# Patient Record
Sex: Male | Born: 1995 | Race: White | Hispanic: No | Marital: Single | Smoking: Never smoker
Health system: Southern US, Community
[De-identification: ages and names within clinical notes are randomized; demographics above are authoritative.]

## PROBLEM LIST (undated history)

## (undated) DIAGNOSIS — J342 Deviated nasal septum: Secondary | ICD-10-CM

## (undated) HISTORY — PX: MANDIBLE SURGERY: SHX707

---

## 2017-06-02 ENCOUNTER — Encounter: Payer: Self-pay | Admitting: Emergency Medicine

## 2017-06-02 ENCOUNTER — Emergency Department
Admission: EM | Admit: 2017-06-02 | Discharge: 2017-06-02 | Disposition: A | Discharge status: Home / Self Care | Payer: Managed Care, Other (non HMO) | Attending: Emergency Medicine | Admitting: Emergency Medicine

## 2017-06-02 ENCOUNTER — Emergency Department: Payer: Managed Care, Other (non HMO)

## 2017-06-02 ENCOUNTER — Other Ambulatory Visit: Payer: Self-pay

## 2017-06-02 DIAGNOSIS — R42 Dizziness and giddiness: Secondary | ICD-10-CM | POA: Diagnosis present

## 2017-06-02 DIAGNOSIS — J189 Pneumonia, unspecified organism: Secondary | ICD-10-CM | POA: Insufficient documentation

## 2017-06-02 HISTORY — DX: Deviated nasal septum: J34.2

## 2017-06-02 LAB — BASIC METABOLIC PANEL
Anion gap: 9 (ref 5–15)
BUN: 17 mg/dL (ref 6–20)
CHLORIDE: 104 mmol/L (ref 101–111)
CO2: 25 mmol/L (ref 22–32)
CREATININE: 1.01 mg/dL (ref 0.61–1.24)
Calcium: 9.3 mg/dL (ref 8.9–10.3)
GFR calc non Af Amer: >60 mL/min (ref >60)
GLUCOSE: 112 mg/dL — AB (ref 65–99)
Potassium: 3.5 mmol/L (ref 3.5–5.1)
Sodium: 138 mmol/L (ref 135–145)

## 2017-06-02 LAB — URINALYSIS, COMPLETE (UACMP) WITH MICROSCOPIC
Bacteria, UA: NONE SEEN
Bilirubin Urine: NEGATIVE
Glucose, UA: NEGATIVE mg/dL
Hgb urine dipstick: NEGATIVE
Ketones, ur: NEGATIVE mg/dL
Leukocytes, UA: NEGATIVE
Nitrite: NEGATIVE
PROTEIN: NEGATIVE mg/dL
SPECIFIC GRAVITY, URINE: 1.015 (ref 1.005–1.030)
SQUAMOUS EPITHELIAL / LPF: NONE SEEN
pH: 7 (ref 5.0–8.0)

## 2017-06-02 LAB — CBC
HCT: 44.5 % (ref 40.0–52.0)
Hemoglobin: 15.7 g/dL (ref 13.0–18.0)
MCH: 31 pg (ref 26.0–34.0)
MCHC: 35.2 g/dL (ref 32.0–36.0)
MCV: 88.1 fL (ref 80.0–100.0)
Platelets: 201 10*3/uL (ref 150–440)
RBC: 5.05 MIL/uL (ref 4.40–5.90)
RDW: 12.2 % (ref 11.5–14.5)
WBC: 8.7 10*3/uL (ref 3.8–10.6)

## 2017-06-02 MED ORDER — AZITHROMYCIN 250 MG PO TABS: ORAL_TABLET | ORAL | 0 refills | Status: AC

## 2017-06-02 MED ORDER — ALBUTEROL SULFATE HFA 108 (90 BASE) MCG/ACT IN AERS: 2.0000 | INHALATION_SPRAY | Freq: Four times a day (QID) | RESPIRATORY_TRACT | 2 refills | Status: AC | PRN

## 2017-06-02 MED ORDER — ALBUTEROL SULFATE (2.5 MG/3ML) 0.083% IN NEBU
5.0000 mg | INHALATION_SOLUTION | Freq: Once | RESPIRATORY_TRACT | Status: AC
  Administered 2017-06-02: 5 mg via RESPIRATORY_TRACT
  Filled 2017-06-02: qty 6

## 2017-06-02 MED ORDER — AZITHROMYCIN 500 MG PO TABS
500.0000 mg | ORAL_TABLET | Freq: Once | ORAL | Status: AC
  Administered 2017-06-02: 500 mg via ORAL
  Filled 2017-06-02: qty 1

## 2017-06-02 NOTE — ED Provider Notes (Addendum)
[facility] Emergency Department Provider Note  ____________________________________________  Time seen: Approximately 6:28 PM  I have reviewed the triage vital signs and the nursing notes.   HISTORY  Chief Complaint Dizziness   HPI Timothy Montoya is a 22 y.o. male but no significant past medical history who presents for evaluation of lightheadedness and shortness of breath. Patient reports that he has had congestion for a month. 2 days ago had a fever at home. Denies any coughing. Today he reports that he felt better in the morning and went to the gym. Since he came back from the gym he has been feeling very short of breath and been feeling lightheaded like he is going to pass out. His congestion has gotten worse. He denies body aches, sore throat, nausea, vomiting, or chest pain. Shortness of breath is only present with exertion and is mild in intensity. He denies any wheezing or prior history of asthma. Is not a smoker.  Past Medical History:  Diagnosis Date  . Deviated septum     There are no active problems to display for this patient.   Past Surgical History:  Procedure Laterality Date  . MANDIBLE SURGERY      Prior to Admission medications   Medication Sig Start Date End Date Taking? Authorizing Provider  albuterol (PROVENTIL HFA;VENTOLIN HFA) 108 (90 Base) MCG/ACT inhaler Inhale 2 puffs into the lungs every 6 (six) hours as needed for wheezing or shortness of breath. 06/02/17   Nita SickleVeronese, [place], MD  azithromycin [facility](ZITHROMAX) 250 MG tablet Take 1 a day for 4 days 06/02/17   Nita SickleVeronese, [place], MD    Allergies Patient has no known allergies.  History reviewed. No pertinent family history.  Social History Social History   Tobacco Use  . Smoking status: Never Smoker  . Smokeless tobacco: Never Used  Substance Use Topics  . Alcohol use: No    Frequency: Never  . Drug use: No    Review of Systems  Constitutional: + fever and  lightheadedness Eyes: Negative for visual changes. ENT: Negative for sore throat. + congestion Neck: No neck pain  Cardiovascular: Negative for chest pain. Respiratory: + shortness of breath. Gastrointestinal: Negative for abdominal pain, vomiting or diarrhea. Genitourinary: Negative for dysuria. Musculoskeletal: Negative for back pain. Skin: Negative for rash. Neurological: Negative for headaches, weakness or numbness. Psych: No SI or HI  ____________________________________________   PHYSICAL EXAM:  VITAL SIGNS: ED Triage Vitals [06/02/17 1724]  Enc Vitals Group     BP (!) 140/48     Pulse Rate 81     Resp 16     Temp 98.6 F (37 C)     Temp Source Oral     SpO2 96 %     Weight      Height      Head Circumference      Peak Flow      Pain Score      Pain Loc      Pain Edu?      Excl. in GC?     Constitutional: Alert and oriented. Well appearing and in no apparent distress. HEENT:      Head: Normocephalic and atraumatic.         Eyes: Conjunctivae are normal. Sclera is non-icteric.       Mouth/Throat: Mucous membranes are moist.       Neck: Supple with no signs of meningismus. Cardiovascular: Regular rate and rhythm. No murmurs, gallops, or rubs. 2+ symmetrical distal pulses are  present in all extremities. No JVD. Respiratory: Normal respiratory effort. Lungs are clear to auscultation bilaterally. No wheezes, crackles, or rhonchi.  Gastrointestinal: Soft, non tender, and non distended with positive bowel sounds. No rebound or guarding. Musculoskeletal: Nontender with normal range of motion in all extremities. No edema, cyanosis, or erythema of extremities. Neurologic: Normal speech and language. Face is symmetric. Moving all extremities. No gross focal neurologic deficits are appreciated. Skin: Skin is warm, dry and intact. No rash noted. Psychiatric: Mood and affect are normal. Speech and behavior are normal.  ____________________________________________    LABS (all labs ordered are listed, but only abnormal results are displayed)  Labs Reviewed  BASIC METABOLIC PANEL - Abnormal; Notable for the following components:      Result Value   Glucose, Bld 112 (*)    All other components within normal limits  URINALYSIS, COMPLETE (UACMP) WITH MICROSCOPIC - Abnormal; Notable for the following components:   Color, Urine YELLOW (*)    APPearance CLEAR (*)    All other components within normal limits  CBC  CBG MONITORING, ED   ____________________________________________  EKG  ED ECG REPORT I, Nita Sickle, the attending physician, personally viewed and interpreted this ECG.  Normal sinus rhythm, rate of 73, normal intervals, normal axis, benign early repolarization, no STE or depression. No prior for comparison.  ____________________________________________  RADIOLOGY  CXR: Mild patchy opacity of left lung base developing pneumonia is not excluded.  ____________________________________________   PROCEDURES  Procedure(s) performed: None Procedures Critical Care performed:  None ____________________________________________   INITIAL IMPRESSION / ASSESSMENT AND PLAN / ED COURSE  22 y.o. male but no significant past medical history who presents for evaluation of lightheadedness and shortness of breath in the setting for several weeks of congestion and fever 3 days ago. patient is extremely well appearing, no distress, normal vitals, normal work of breathing, normal sats, lungs are clear to auscultation, he is afebrile. Labs show a normal white count, normal BMP. Chest x-ray concerning for possible developing pneumonia. Will start patient on azithromycin. We'll give albuterol to see if patient has improvement of SOB.   _________________________ 7:16 PM on 06/02/2017 -----------------------------------------  patient reports improvement of his shortness of breath after albuterol. His can be discharged home on a Z-Pak and  albuterol inhaler. Discussed return precautions and close follow-up.  As part of my medical decision making, I reviewed the following data within the electronic MEDICAL RECORD NUMBER Nursing notes reviewed and incorporated, Labs reviewed , Radiograph reviewed , Notes from prior ED visits and [place] Controlled Substance Database. EKG reviewed.    Pertinent labs & imaging results that were available during my care of the patient were reviewed by me and considered in my medical decision making (see chart for details).    ____________________________________________   FINAL CLINICAL IMPRESSION(S) / ED DIAGNOSES  Final diagnoses:  Community acquired pneumonia of left lung, unspecified part of lung      NEW MEDICATIONS STARTED DURING THIS VISIT:  ED Discharge Orders        Ordered    albuterol (PROVENTIL HFA;VENTOLIN HFA) 108 (90 Base) MCG/ACT inhaler  Every 6 hours PRN     06/02/17 1916    azithromycin (ZITHROMAX) 250 MG tablet     06/02/17 1916       Note:  This document was prepared using Dragon voice recognition software and may include unintentional dictation errors.    Don Perking, [place], MD 06/02/17 6045    Nita Sickle, MD 06/02/17 [phone]

## 2017-06-02 NOTE — ED Triage Notes (Signed)
First Nurse Note:  Arrives with C/O congestion and general sickness.  No SOB/ DOE.  Skin warm and dry.  NAD

## 2017-06-02 NOTE — ED Triage Notes (Addendum)
Pt started with some congestion this morning.  [facility] and feels different than his sinus congestion and allergies.  Felt lightheaded today and like going to pass out.  Ambulatory with steady gait to triage. NAD. "Biggest thing is I feel like can't breathe and going to pass out"/.

## 2017-06-02 NOTE — Discharge Instructions (Signed)

## 2017-09-16 ENCOUNTER — Other Ambulatory Visit: Payer: Self-pay

## 2017-09-16 ENCOUNTER — Emergency Department
Admission: EM | Admit: 2017-09-16 | Discharge: 2017-09-16 | Disposition: A | Discharge status: Home / Self Care | Payer: Managed Care, Other (non HMO) | Attending: Emergency Medicine | Admitting: Emergency Medicine

## 2017-09-16 ENCOUNTER — Emergency Department: Payer: Managed Care, Other (non HMO)

## 2017-09-16 DIAGNOSIS — R0602 Shortness of breath: Secondary | ICD-10-CM | POA: Insufficient documentation

## 2017-09-16 DIAGNOSIS — Z711 Person with feared health complaint in whom no diagnosis is made: Secondary | ICD-10-CM

## 2017-09-16 MED ORDER — IPRATROPIUM-ALBUTEROL 0.5-2.5 (3) MG/3ML IN SOLN
3.0000 mL | Freq: Once | RESPIRATORY_TRACT | Status: AC
  Administered 2017-09-16: 3 mL via RESPIRATORY_TRACT
  Filled 2017-09-16: qty 3

## 2017-09-16 MED ORDER — PREDNISONE 10 MG PO TABS: ORAL_TABLET | ORAL | 0 refills | Status: AC

## 2017-09-16 NOTE — ED Notes (Signed)
See triage note  States he developed some SOB which started yesterday  No fever  Min relief with inhaler which was left over from Jan

## 2017-09-16 NOTE — ED Provider Notes (Addendum)
[facility] Emergency Department Provider Note  ____________________________________________  Time seen: Approximately 1:13 PM  I have reviewed the triage vital signs and the nursing notes.   HISTORY  Chief Complaint Shortness of Breath    HPI Timothy Montoya is a 22 y.o. male that presents to the  emergency department for evaluation of shortness of breath for 2 days.  Patient states that shortness of breath started while working out at the gym.  After he got home from the gym, he felt a slightly dizzy. Dizziness improved after rest and eating.  He had another episode of shortness of breath later in the evening.  He felt well this morning but then while driving in his car began to feel short of breath again.  He states that shortness of breath is also elicited by eating.  He has been using the albuterol inhaler he got in January, which helps.  When he was here in January, he received an nebulizer treatment, which helped the most.  He has allergies.  He states that he also has a deviated septum and makes it difficult to breathe out of his nose. No recent illness. No palpitations, tachycardia, CP, nausea, vomiting.   Past Medical History:  Diagnosis Date  . Deviated septum     There are no active problems to display for this patient.   Past Surgical History:  Procedure Laterality Date  . MANDIBLE SURGERY      Prior to Admission medications   Medication Sig Start Date End Date Taking? Authorizing Provider  albuterol (PROVENTIL HFA;VENTOLIN HFA) 108 (90 Base) MCG/ACT inhaler Inhale 2 puffs into the lungs every 6 (six) hours as needed for wheezing or shortness of breath. 06/02/17   Nita Sickle, MD  azithromycin [facility]) 250 MG tablet Take 1 a day for 4 days 06/02/17   Nita Sickle, MD    Allergies Patient has no known allergies.  No family history on file.  Social History Social History   Tobacco Use  . Smoking status: Never Smoker  .  Smokeless tobacco: Never Used  Substance Use Topics  . Alcohol use: No    Frequency: Never  . Drug use: No     Review of Systems  Constitutional: No fever/chills ENT: No upper respiratory complaints. Cardiovascular: No chest pain. Respiratory: Positive for SOB Gastrointestinal: No abdominal pain.  No nausea, no vomiting.  Musculoskeletal: Negative for musculoskeletal pain. Skin: Negative for rash, abrasions, lacerations, ecchymosis. Neurological: Negative for headaches   ____________________________________________   PHYSICAL EXAM:  VITAL SIGNS: ED Triage Vitals [09/16/17 1122]  Enc Vitals Group     BP 140/64     Pulse Rate (!) 57     Resp 15     Temp 97.6 F (36.4 C)     Temp Source Oral     SpO2 100 %     Weight 165 lb (74.8 kg)     Height  (1.803 m)     Head Circumference      Peak Flow      Pain Score 0     Pain Loc      Pain Edu?      Excl. in GC?      Constitutional: Alert and oriented. Well appearing and in no acute distress. Eyes: Conjunctivae are normal. PERRL. EOMI. Head: Atraumatic. ENT:      Ears:      Nose: No congestion/rhinnorhea.      Mouth/Throat: Mucous membranes are moist.  Neck: No stridor.  Cardiovascular: Normal rate, regular rhythm.  Good peripheral circulation. Respiratory: Normal respiratory effort without tachypnea or retractions. Lungs CTAB. Good air entry to the bases with no decreased or absent breath sounds. Gastrointestinal: Bowel sounds 4 quadrants. Soft and nontender to palpation. No guarding or rigidity. No palpable masses. No distention.  Musculoskeletal: Full range of motion to all extremities. No gross deformities appreciated. Neurologic:  Normal speech and language. No gross focal neurologic deficits are appreciated.  Skin:  Skin is warm, dry and intact. No rash noted. Psychiatric: Mood and affect are normal. Speech and behavior are normal. Patient exhibits appropriate insight and  judgement.   ____________________________________________   LABS (all labs ordered are listed, but only abnormal results are displayed)  Labs Reviewed - No data to display ____________________________________________  EKG  NSR ____________________________________________  RADIOLOGY I, Enid Derry, personally viewed and evaluated these images (plain radiographs) as part of my medical decision making, as well as reviewing the written report by the radiologist  Dg Chest 2 View  Result Date: 09/16/2017 CLINICAL DATA:  Shortness of breath EXAM: CHEST - 2 VIEW COMPARISON:  June 02, 2017 FINDINGS: There is no edema or consolidation. The heart size and pulmonary vascularity are normal. No adenopathy. No bone lesions. IMPRESSION: No edema or consolidation. Electronically Signed   By: Bretta Bang III M.D.   On: 09/16/2017 12:11    ____________________________________________    PROCEDURES  Procedure(s) performed:    Procedures    Medications  ipratropium-albuterol (DUONEB) 0.5-2.5 (3) MG/3ML nebulizer solution 3 mL (3 mLs Nebulization Given 09/16/17 1312)     ____________________________________________   INITIAL IMPRESSION / ASSESSMENT AND PLAN / ED COURSE  Pertinent labs & imaging results that were available during my care of the patient were reviewed by me and considered in my medical decision making (see chart for details).  Review of the [place] CSRS was performed in accordance of the [facility] prior to dispensing any controlled drugs.   Patient presented to emergency department for evaluation of shortness of breath for 2 days.  Signs and exam are reassuring.  Chest x-ray is negative for acute cardiopulmonary processes.  Symptoms are consistent with asthma.  Symptoms improved after DuoNeb.  Patient appears extremely well and in no distress.  Patient will be discharged home with prescriptions for prednisone.  His albuterol inhaler is full..  Patient is to follow up with  PCP as directed. Patient is given ED precautions to return to the ED for any worsening or new symptoms.     ____________________________________________  FINAL CLINICAL IMPRESSION(S) / ED DIAGNOSES  Final diagnoses:  Shortness of breath      NEW MEDICATIONS STARTED DURING THIS VISIT:  ED Discharge Orders    None          This chart was dictated using voice recognition software/Dragon. Despite best efforts to proofread, errors can occur which can change the meaning. Any change was purely unintentional.    Enid Derry, PA-C 09/16/17 1454    Enid Derry, PA-C 09/16/17 1455    Don Perking, [place], MD 09/18/17 [phone]

## 2017-09-16 NOTE — ED Triage Notes (Signed)
Pt c/o for the past 2 days having SOB with excursion. States he has been using the inhaler he was given in January when he had pneumonia. Denies cough or congestion. Pt is in NAD, respirations WNL, no audible wheezing noted.

## 2019-08-25 IMAGING — CR DG CHEST 2V
2 series · 2 of 2 positions shown · non-contrast
Comparison: June 02, 2017

CLINICAL DATA: Shortness of breath

EXAM:
CHEST - 2 VIEW

[chest pa]
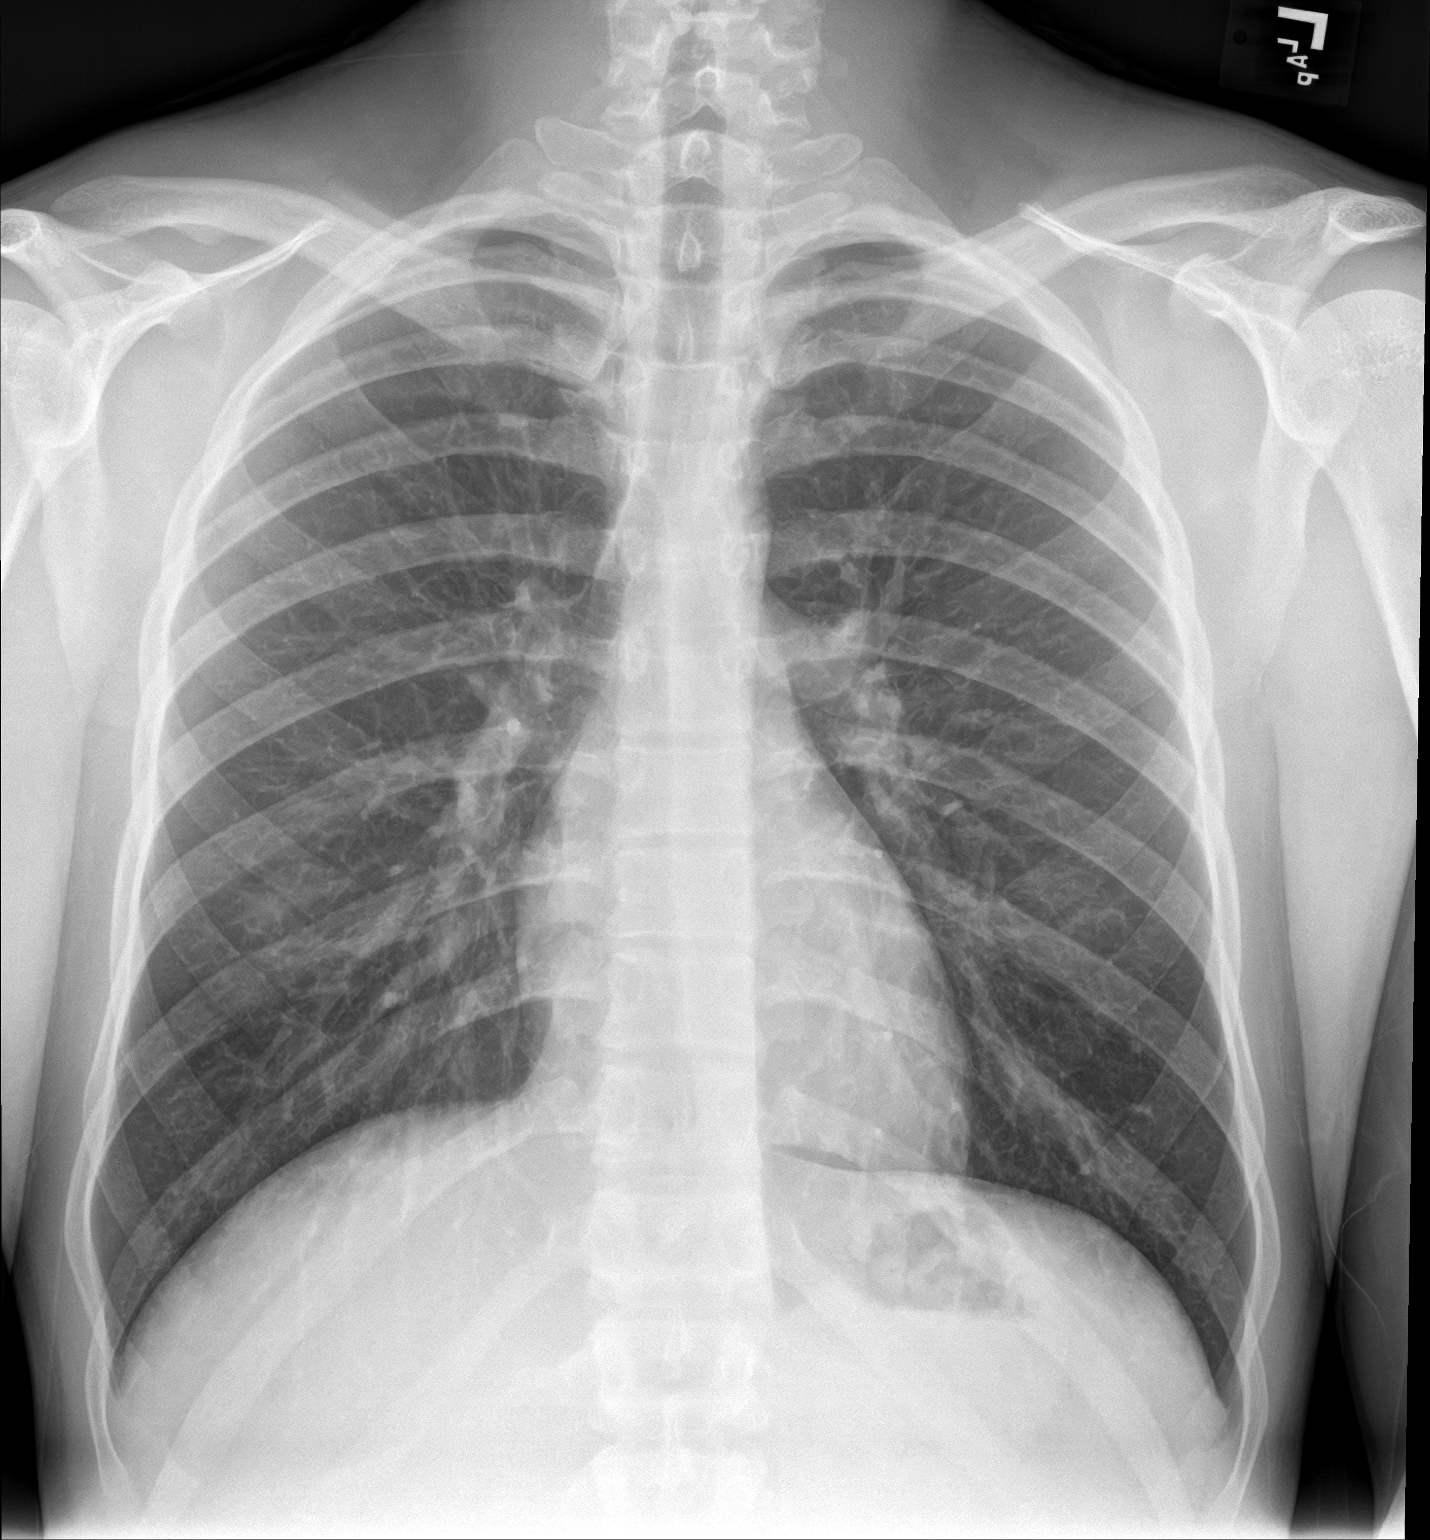

[chest lat]
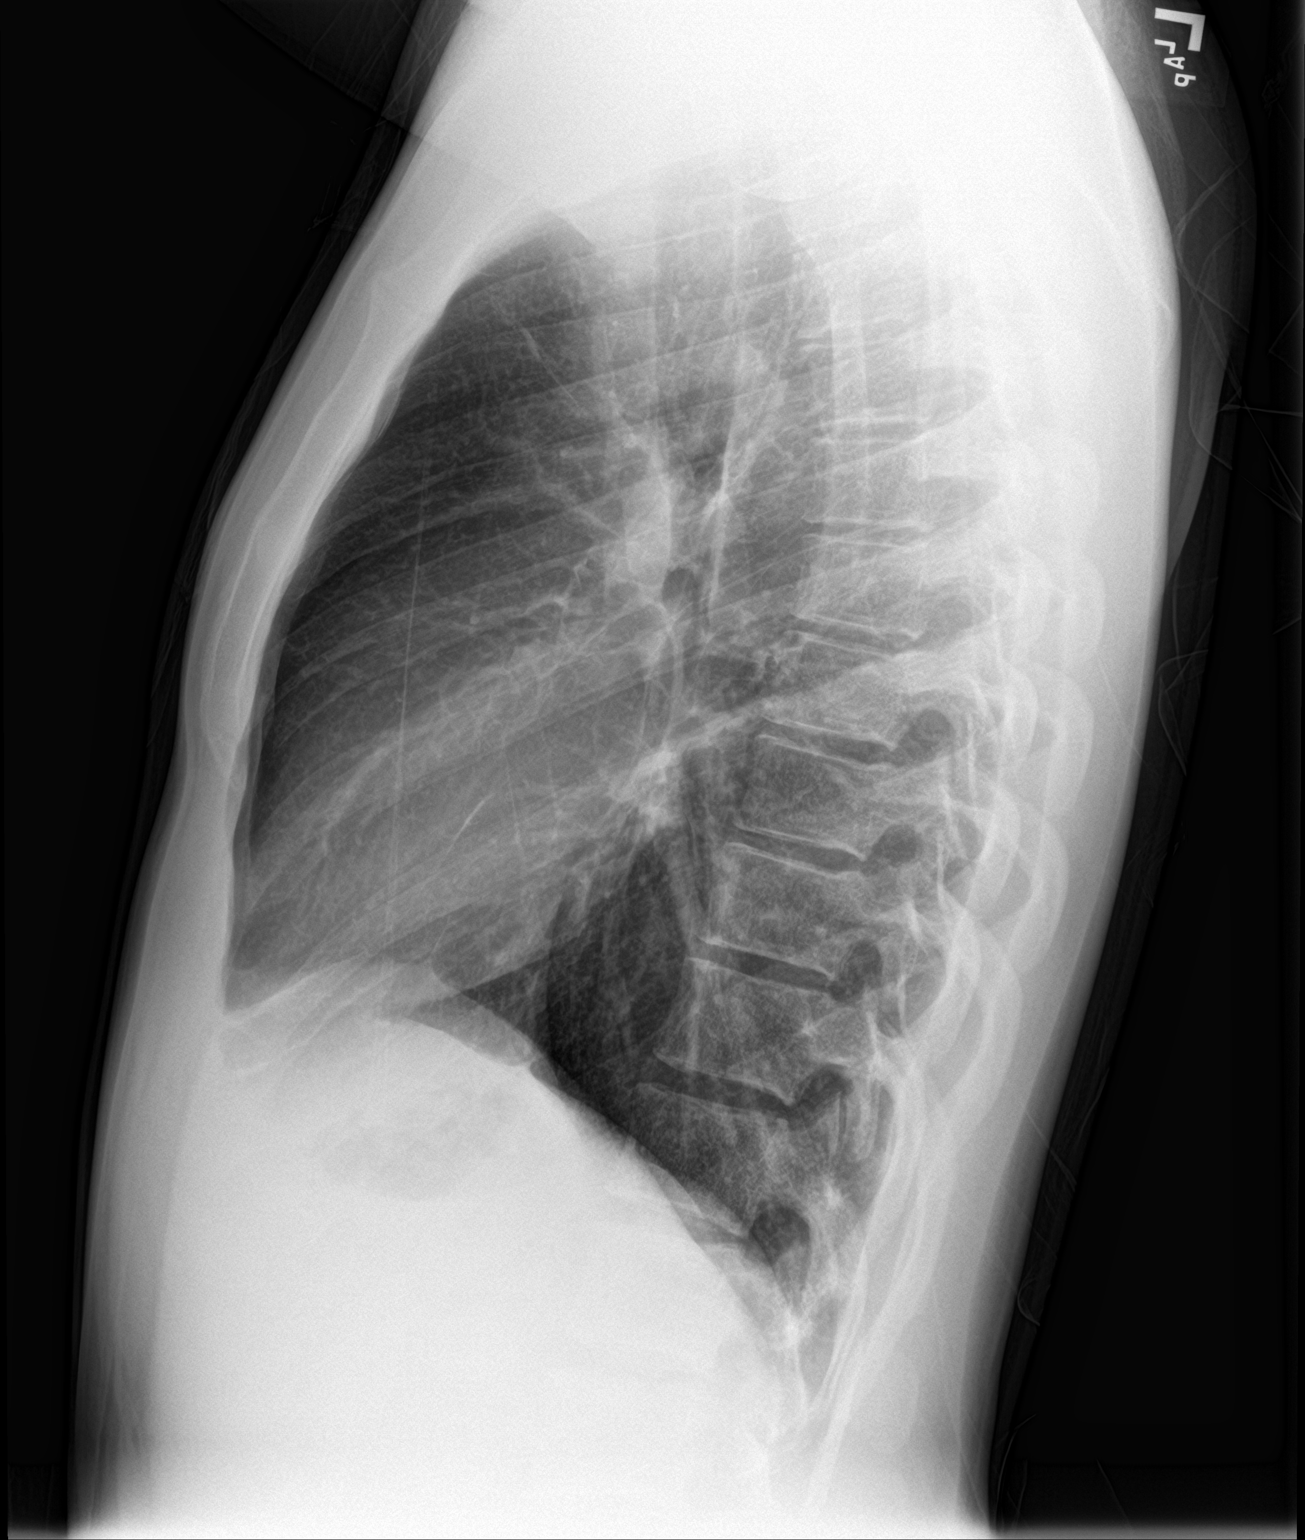

[2 of 2 positions shown; findings below may reference images not displayed]

FINDINGS: There is no edema or consolidation. The heart size and pulmonary
vascularity are normal. No adenopathy. No bone lesions.
IMPRESSION: No edema or consolidation.
